# Patient Record
Sex: Female | Born: 1990 | Race: White | Hispanic: No | Marital: Married | Smoking: Never smoker
Health system: Southern US, Community
[De-identification: ages and names within clinical notes are randomized; demographics above are authoritative.]

## PROBLEM LIST (undated history)

## (undated) DIAGNOSIS — F329 Major depressive disorder, single episode, unspecified: Secondary | ICD-10-CM

## (undated) DIAGNOSIS — F32A Depression, unspecified: Secondary | ICD-10-CM

## (undated) DIAGNOSIS — F419 Anxiety disorder, unspecified: Secondary | ICD-10-CM

## (undated) HISTORY — PX: NO PAST SURGERIES: SHX2092

---

## 2017-07-29 NOTE — L&D Delivery Note (Addendum)
Delivery Note Kayla Griffith is a 27 y.o. G2P1001 at [redacted]w[redacted]d admitted for SOL, GHTN dx intrapartum .  Labor course: progressed w/o any augmentation At 0128 a viable female was delivered via spontaneous vaginal delivery (Presentation: LOA).  Vigorous infant placed directly on mom's abdomen for bonding/skin-to-skin. Delayed cord clamping x , then cord clamped x 2, and cut by fob.  APGAR: see delivery summary; weight: pending at time of note.  40 units of pitocin diluted in 1000cc LR was infused rapidly IV per protocol. The placenta separated spontaneously and delivered via CCT and maternal pushing effort.  It was inspected and appears to be intact with a 3 VC.  Placenta/Cord with the following complications: marginal insertion .  Cord pH: not done  Intrapartum complications:  GHTN Anesthesia:  epidural Episiotomy: none Lacerations:  none Suture Repair: n/a Est. Blood Loss (mL): Sponge and instrument count were correct x2.  Mom to postpartum.  Baby to Couplet care / Skin to Skin. Placenta to L&D. Plans to breast and bottlefeed Contraception: depo then vasectomy Circ: outpatient  Cheral Marker CNM, WHNP-BC 05/06/2018 1:40 AM   Cheral Marker, CNM  P [facility] Admin Pool        Please schedule this patient for PP visit in: 1 week bp check, 4-6wk pp visit  High risk pregnancy complicated by: no pnc, intrapartum GHTN  Delivery mode: SVD  Anticipated Birth Control: Depo>vasectomy  PP Procedures needed: BP check  Schedule Integrated BH visit: no  Provider: Any provider

## 2017-09-30 ENCOUNTER — Emergency Department (HOSPITAL_COMMUNITY)
Admission: EM | Admit: 2017-09-30 | Discharge: 2017-10-01 | Disposition: A | Discharge status: Home / Self Care | Payer: Medicaid Other | Attending: Emergency Medicine | Admitting: Emergency Medicine

## 2017-09-30 ENCOUNTER — Other Ambulatory Visit: Payer: Self-pay

## 2017-09-30 ENCOUNTER — Encounter (HOSPITAL_COMMUNITY): Payer: Self-pay | Admitting: Emergency Medicine

## 2017-09-30 DIAGNOSIS — O219 Vomiting of pregnancy, unspecified: Secondary | ICD-10-CM | POA: Insufficient documentation

## 2017-09-30 DIAGNOSIS — Z3491 Encounter for supervision of normal pregnancy, unspecified, first trimester: Secondary | ICD-10-CM | POA: Insufficient documentation

## 2017-09-30 DIAGNOSIS — Z3A12 12 weeks gestation of pregnancy: Secondary | ICD-10-CM | POA: Insufficient documentation

## 2017-09-30 DIAGNOSIS — Z3490 Encounter for supervision of normal pregnancy, unspecified, unspecified trimester: Secondary | ICD-10-CM

## 2017-09-30 LAB — COMPREHENSIVE METABOLIC PANEL
ALBUMIN: 4.5 g/dL (ref 3.5–5.0)
ALK PHOS: 67 U/L (ref 38–126)
ALT: 18 U/L (ref 14–54)
ANION GAP: 12 (ref 5–15)
AST: 20 U/L (ref 15–41)
BILIRUBIN TOTAL: 1.2 mg/dL (ref 0.3–1.2)
BUN: 18 mg/dL (ref 6–20)
CALCIUM: 9.3 mg/dL (ref 8.9–10.3)
CO2: 20 mmol/L — ABNORMAL LOW (ref 22–32)
Chloride: 104 mmol/L (ref 101–111)
Creatinine, Ser: 0.71 mg/dL (ref 0.44–1.00)
GFR calc Af Amer: >60 mL/min (ref >60)
GLUCOSE: 112 mg/dL — AB (ref 65–99)
Potassium: 3.7 mmol/L (ref 3.5–5.1)
Sodium: 136 mmol/L (ref 135–145)
TOTAL PROTEIN: 8 g/dL (ref 6.5–8.1)

## 2017-09-30 LAB — CBC
HCT: 41.4 % (ref 36.0–46.0)
HEMOGLOBIN: 15 g/dL (ref 12.0–15.0)
MCH: 30.6 pg (ref 26.0–34.0)
MCHC: 36.2 g/dL — AB (ref 30.0–36.0)
MCV: 84.5 fL (ref 78.0–100.0)
Platelets: 336 10*3/uL (ref 150–400)
RBC: 4.9 MIL/uL (ref 3.87–5.11)
RDW: 13.2 % (ref 11.5–15.5)
WBC: 16.8 10*3/uL — ABNORMAL HIGH (ref 4.0–10.5)

## 2017-09-30 LAB — URINALYSIS, ROUTINE W REFLEX MICROSCOPIC
Bilirubin Urine: NEGATIVE
GLUCOSE, UA: NEGATIVE mg/dL
HGB URINE DIPSTICK: NEGATIVE
Ketones, ur: 80 mg/dL — AB
Leukocytes, UA: NEGATIVE
NITRITE: NEGATIVE
Protein, ur: 100 mg/dL — AB
SPECIFIC GRAVITY, URINE: 1.028 (ref 1.005–1.030)
pH: 5 (ref 5.0–8.0)

## 2017-09-30 LAB — LIPASE, BLOOD: Lipase: 29 U/L (ref 11–51)

## 2017-09-30 LAB — HCG, QUANTITATIVE, PREGNANCY: hCG, Beta Chain, Quant, S: 85254 m[IU]/mL — ABNORMAL HIGH (ref <5)

## 2017-09-30 NOTE — ED Triage Notes (Signed)
Pt st's she is 12 weeks preg and has had diarrhea x's 2 weeks, also c/o nausea and vomiting st's unable to keep anything down

## 2017-10-01 ENCOUNTER — Emergency Department (HOSPITAL_COMMUNITY): Payer: Medicaid Other

## 2017-10-01 MED ORDER — SODIUM CHLORIDE 0.9 % IV BOLUS (SEPSIS)
1000.0000 mL | Freq: Once | INTRAVENOUS | Status: AC
  Administered 2017-10-01: 1000 mL via INTRAVENOUS

## 2017-10-01 MED ORDER — DOXYLAMINE-PYRIDOXINE 10-10 MG PO TBEC: 1.0000 | DELAYED_RELEASE_TABLET | Freq: Every day | ORAL | 0 refills | Status: DC

## 2017-10-01 MED ORDER — PRENATAL VITAMINS 0.8 MG PO TABS: 1.0000 | ORAL_TABLET | Freq: Every day | ORAL | 0 refills | Status: AC

## 2017-10-01 MED ORDER — THIAMINE HCL 100 MG/ML IJ SOLN
100.0000 mg | Freq: Once | INTRAMUSCULAR | Status: AC
  Administered 2017-10-01: 100 mg via INTRAVENOUS
  Filled 2017-10-01: qty 2

## 2017-10-01 MED ORDER — ONDANSETRON HCL 4 MG/2ML IJ SOLN
4.0000 mg | Freq: Once | INTRAMUSCULAR | Status: AC
  Administered 2017-10-01: 4 mg via INTRAVENOUS
  Filled 2017-10-01: qty 2

## 2017-10-01 NOTE — ED Provider Notes (Signed)
[facility] EMERGENCY DEPARTMENT Provider Note   CSN: [id number] Arrival date & time: 09/30/17  2157     History   Chief Complaint Chief Complaint  Patient presents with  . Emesis    HPI Kayla Griffith is a 27 y.o. female.  HPI  This is a 27 year old G3 P8 female who presents with vomiting.  Patient reports that she thinks she is approximately [redacted] weeks pregnant.  She has not had any prenatal care.  Last period was in January.  She denies any abdominal pain or vaginal bleeding.  She does report ongoing and worsening vomiting.  She reports history of similar symptoms with prior pregnancies requiring multiple medications.  She states over the last 1-2 days she has had worsening vomiting and is unable to keep anything down.  She is not taking anything for her symptoms.  History reviewed. No pertinent past medical history.  There are no active problems to display for this patient.   History reviewed. No pertinent surgical history.  OB History    Gravida Para Term Preterm AB Living   1             SAB TAB Ectopic Multiple Live Births                   Home Medications    Prior to Admission medications   Medication Sig Start Date End Date Taking? Authorizing Provider  Doxylamine-Pyridoxine (DICLEGIS) 10-10 MG TBEC Take 1 tablet by mouth at bedtime. 10/01/17   Annelies Coyt, Mayer Masker, MD  Prenatal Multivit-Min-Fe-FA (PRENATAL VITAMINS) 0.8 MG tablet Take 1 tablet by mouth daily. 10/01/17   Obaloluwa Delatte, Mayer Masker, MD    Family History No family history on file.  Social History Social History   Tobacco Use  . Smoking status: Never Smoker  . Smokeless tobacco: Never Used  Substance Use Topics  . Alcohol use: No    Frequency: Never  . Drug use: No     Allergies   Patient has no known allergies.   Review of Systems Review of Systems  Respiratory: Negative for shortness of breath.   Cardiovascular: Negative for chest pain.  Gastrointestinal: Positive for  diarrhea, nausea and vomiting. Negative for abdominal pain.  Genitourinary: Negative for vaginal bleeding and vaginal discharge.  All other systems reviewed and are negative.    Physical Exam Updated Vital Signs BP 116/72   Pulse (!) 54   Temp 98.6 F (37 C) (Oral)   Resp 18   Ht 5\' 5"  (1.651 m)   Wt 77.1 kg (170 lb)   LMP 07/29/2017 (Exact Date)   SpO2 99%   BMI 28.29 kg/m   Physical Exam  Constitutional: She is oriented to person, place, and time. She appears well-developed and well-nourished. No distress.  HENT:  Head: Normocephalic and atraumatic.  Mucous membranes dry  Cardiovascular: Normal rate, regular rhythm and normal heart sounds.  Pulmonary/Chest: Effort normal and breath sounds normal. No respiratory distress. She has no wheezes.  Abdominal: Soft. Bowel sounds are normal. There is no tenderness. There is no guarding.  Neurological: She is alert and oriented to person, place, and time.  Skin: Skin is warm and dry.  Psychiatric: She has a normal mood and affect.  Nursing note and vitals reviewed.    ED Treatments / Results  Labs (all labs ordered are listed, but only abnormal results are displayed) Labs Reviewed  COMPREHENSIVE METABOLIC PANEL - Abnormal; Notable for the following components:  Result Value   CO2 20 (*)    Glucose, Bld 112 (*)    All other components within normal limits  CBC - Abnormal; Notable for the following components:   WBC 16.8 (*)    MCHC 36.2 (*)    All other components within normal limits  URINALYSIS, ROUTINE W REFLEX MICROSCOPIC - Abnormal; Notable for the following components:   APPearance CLOUDY (*)    Ketones, ur 80 (*)    Protein, ur 100 (*)    Bacteria, UA RARE (*)    Squamous Epithelial / LPF 6-30 (*)    All other components within normal limits  HCG, QUANTITATIVE, PREGNANCY - Abnormal; Notable for the following components:   hCG, Beta Chain, Quant, S 85,254 (*)    All other components within normal limits    LIPASE, BLOOD    EKG  EKG Interpretation None       Radiology [place] Ob Comp Less 14 Wks  Result Date: 10/01/2017 CLINICAL DATA:  Initial evaluation for acute vomiting, early pregnancy. EXAM: OBSTETRIC <14 WK US AND TRANSVAGINAL OB US TECHNIQUE: Both transabdominal and transvaginal ultrasound examinations were performed for complete evaluation of the gestation as well as the maternal uterus, adnexal regions, and pelvic cul-de-sac. Transvaginal technique was performed to assess early pregnancy. COMPARISON:  None. FINDINGS: Intrauterine gestational sac: Single Yolk sac:  Present Embryo:  Present Cardiac Activity: Present Heart Rate: 175 bpm CRL:  24.4 mm   9 w   1 d                  US EDC: 05/05/2018 Subchorionic hemorrhage:  None visualized. Maternal uterus/adnexae: Ovaries are normal in appearance bilaterally. No adnexal mass. No free fluid. IMPRESSION: 1. Single viable intrauterine pregnancy as above without complication, estimated gestational age [redacted] weeks and 1 day by crown-rump length. 2. No other acute maternal uterine or adnexal abnormality identified. Electronically Signed   By: Rise MuBenjamin  McClintock M.D.   On: 10/01/2017 02:58   [place] Ob Transvaginal  Result Date: 10/01/2017 CLINICAL DATA:  Initial evaluation for acute vomiting, early pregnancy. EXAM: OBSTETRIC <14 WK US AND TRANSVAGINAL OB US TECHNIQUE: Both transabdominal and transvaginal ultrasound examinations were performed for complete evaluation of the gestation as well as the maternal uterus, adnexal regions, and pelvic cul-de-sac. Transvaginal technique was performed to assess early pregnancy. COMPARISON:  None. FINDINGS: Intrauterine gestational sac: Single Yolk sac:  Present Embryo:  Present Cardiac Activity: Present Heart Rate: 175 bpm CRL:  24.4 mm   9 w   1 d                  US EDC: 05/05/2018 Subchorionic hemorrhage:  None visualized. Maternal uterus/adnexae: Ovaries are normal in appearance bilaterally. No adnexal mass. No free  fluid. IMPRESSION: 1. Single viable intrauterine pregnancy as above without complication, estimated gestational age [redacted] weeks and 1 day by crown-rump length. 2. No other acute maternal uterine or adnexal abnormality identified. Electronically Signed   By: Rise MuBenjamin  McClintock M.D.   On: 10/01/2017 02:58    Procedures Procedures (including critical care time)  Medications Ordered in ED Medications  sodium chloride 0.9 % bolus 1,000 mL (1,000 mLs Intravenous New Bag/Given 10/01/17 0049)  ondansetron (ZOFRAN) injection 4 mg (4 mg Intravenous Given 10/01/17 0049)  thiamine (B-1) injection 100 mg (100 mg Intravenous Given 10/01/17 0049)     Initial Impression / Assessment and Plan / ED Course  I have reviewed the triage vital signs and the nursing notes.  Pertinent labs & imaging results that were available during my care of the patient were reviewed by me and considered in my medical decision making (see chart for details).     Patient presents with vomiting in early pregnancy.  She has not had any prenatal care.  She is nontoxic appearing and afebrile.  No significant vital sign derangements.  She does appear dry.  Patient was given fluids and thiamine as well as Zofran.  Bedside ultrasound was attempted.  Intrauterine gestational sac was visualized but no fetal pole.  Request formal ultrasound for confirmation of intrauterine pregnancy with transvaginal approach.  Patient would be approximately 8 weeks and 4 days by last menstrual period.  Ultrasound shows a 9-week 1 day pregnancy without any complication.  On recheck, patient states that she feels much better.  She is able to tolerate fluids on her own.  Will refer to women's clinic.  Patient discharged with DIclegis and prenatal vitamin.  After history, exam, and medical workup I feel the patient has been appropriately medically screened and is safe for discharge home. Pertinent diagnoses were discussed with the patient. Patient was given return  precautions.     Final Clinical Impressions(s) / ED Diagnoses   Final diagnoses:  Vomiting pregnancy  Intrauterine pregnancy    ED Discharge Orders        Ordered    Doxylamine-Pyridoxine (DICLEGIS) 10-10 MG TBEC  Daily at bedtime     10/01/17 0332    Prenatal Multivit-Min-Fe-FA (PRENATAL VITAMINS) 0.8 MG tablet  Daily     10/01/17 0332       Shon Baton, MD 10/01/17 [phone]

## 2018-03-15 IMAGING — US US OB TRANSVAGINAL
1 series · 14 of 28 positions shown · non-contrast
Comparison: None.

CLINICAL DATA: Initial evaluation for acute vomiting, early
pregnancy.

EXAM:
OBSTETRIC <14 WK US AND TRANSVAGINAL OB US
TECHNIQUE: Both transabdominal and transvaginal ultrasound examinations were
performed for complete evaluation of the gestation as well as the
maternal uterus, adnexal regions, and pelvic cul-de-sac.
Transvaginal technique was performed to assess early pregnancy.

[Series 1: us ob transvaginal · 0.21mm/px · 14 of 46 slices shown]
[im 2/46]
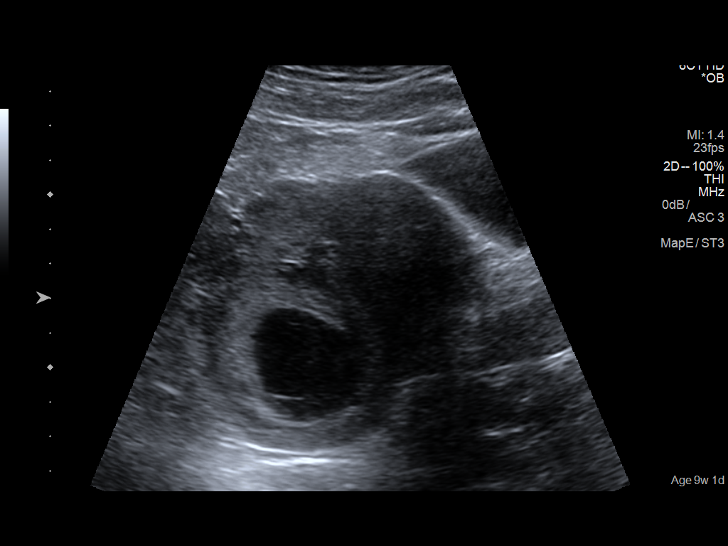
[im 6/46]
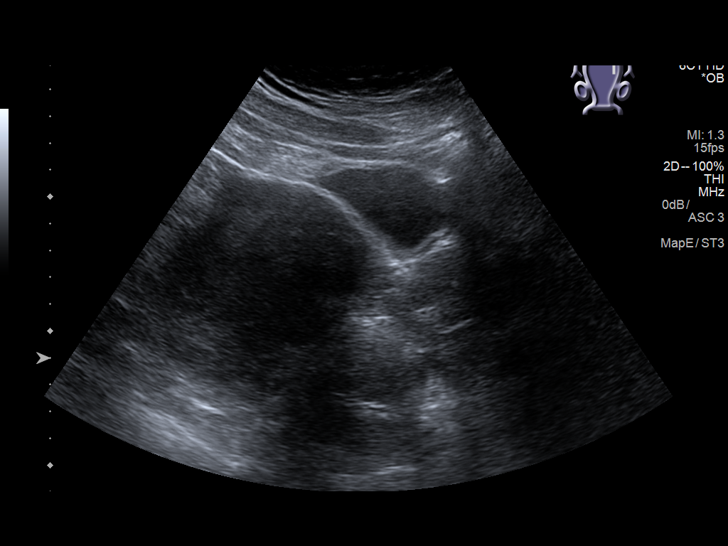
[im 9/46]
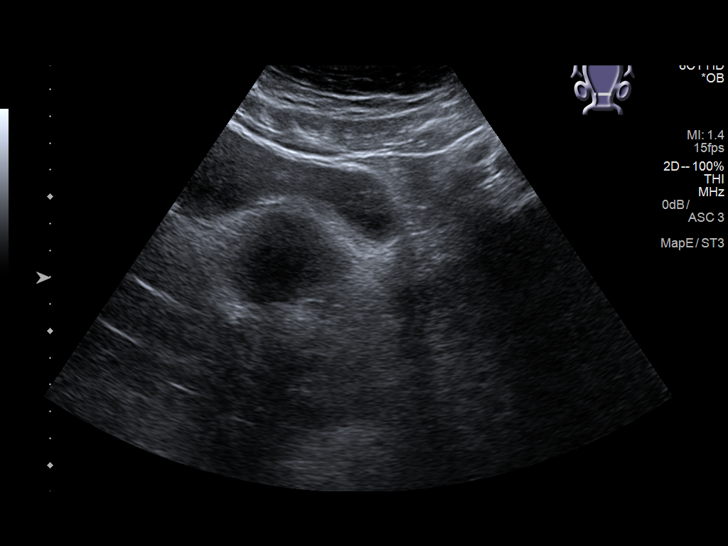
[im 12/46]
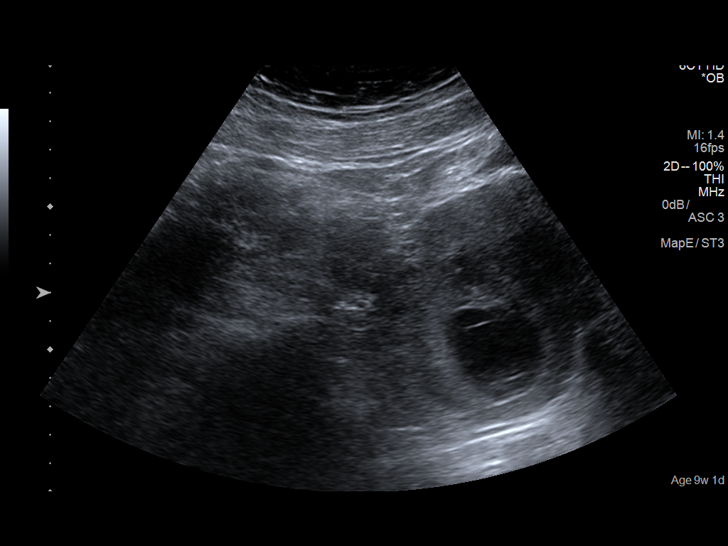
[im 16/46]
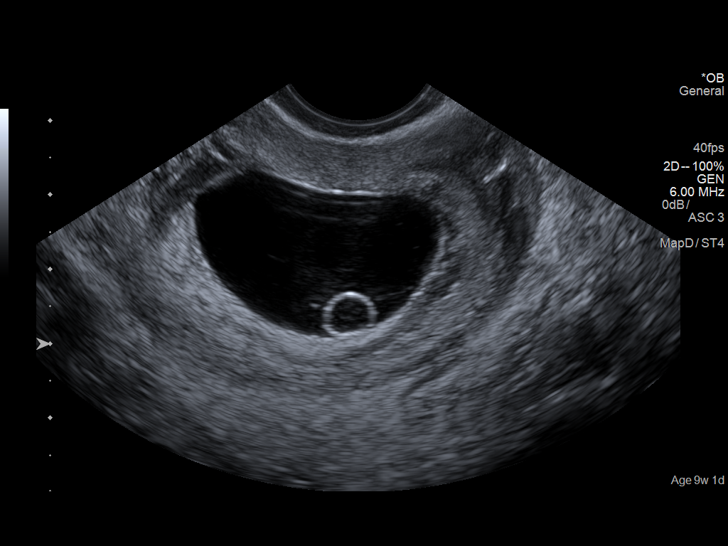
[im 19/46]
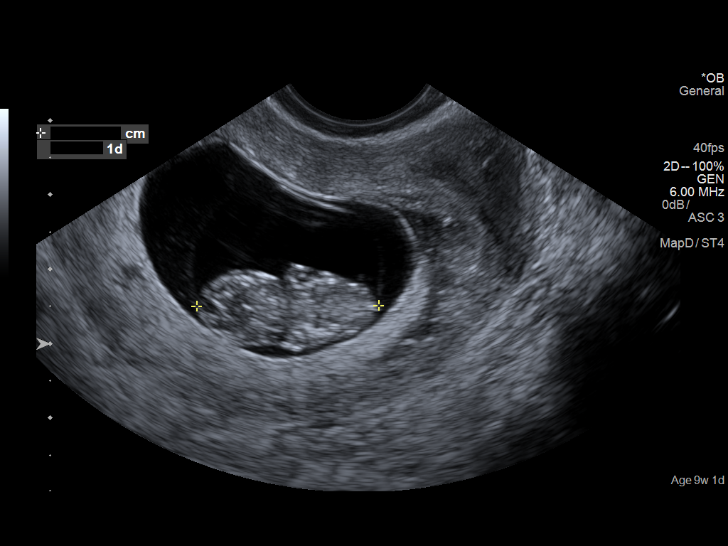
[im 22/46]
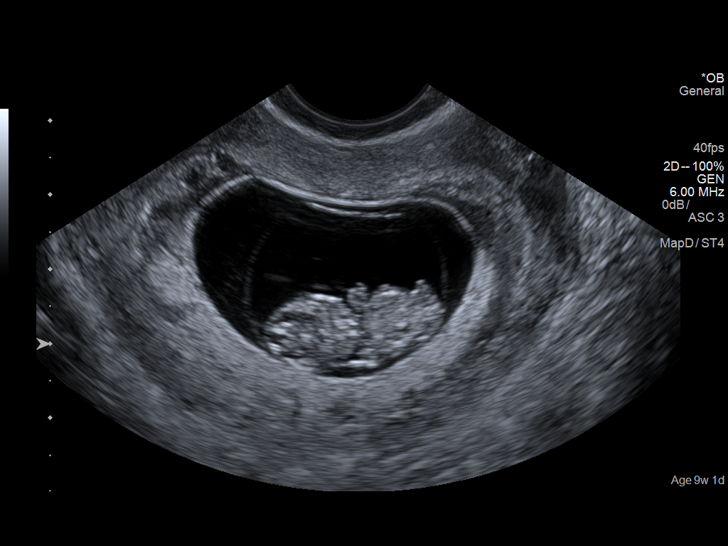
[im 26/46]
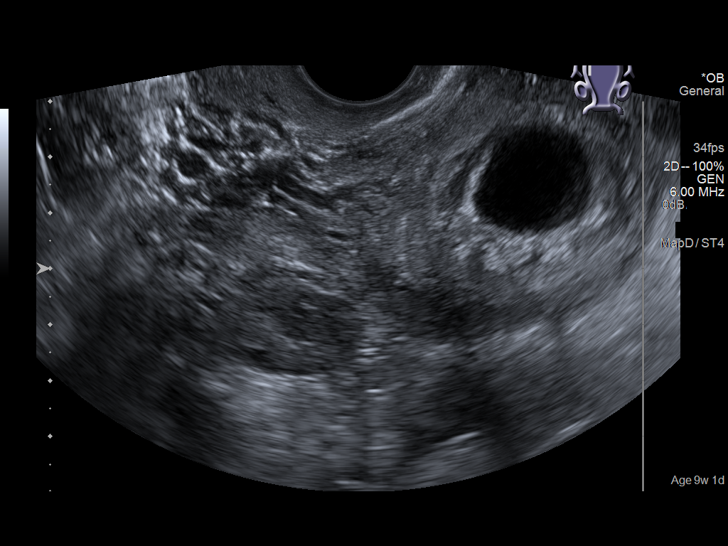
[im 29/46]
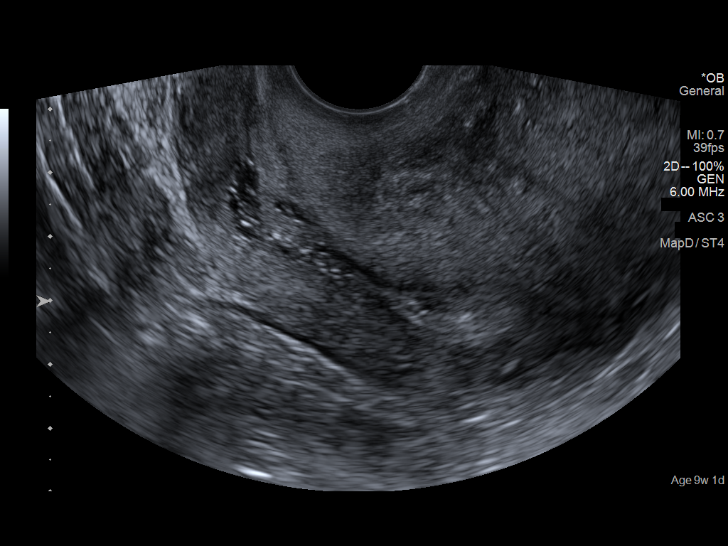
[im 32/46]
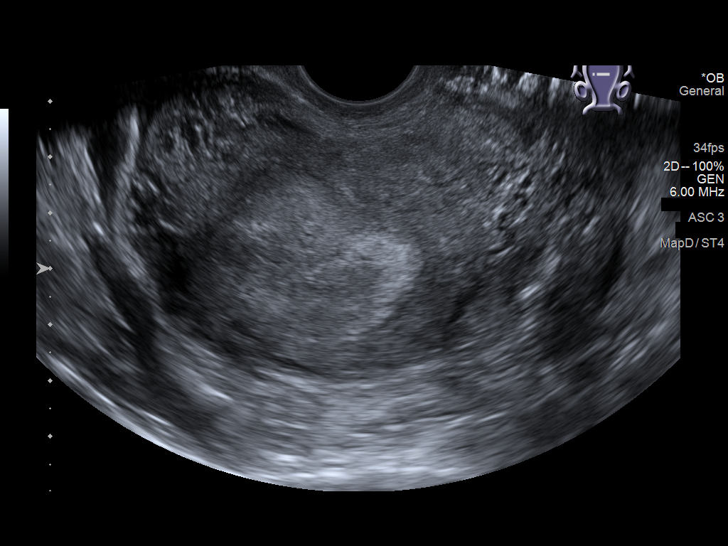
[im 36/46]
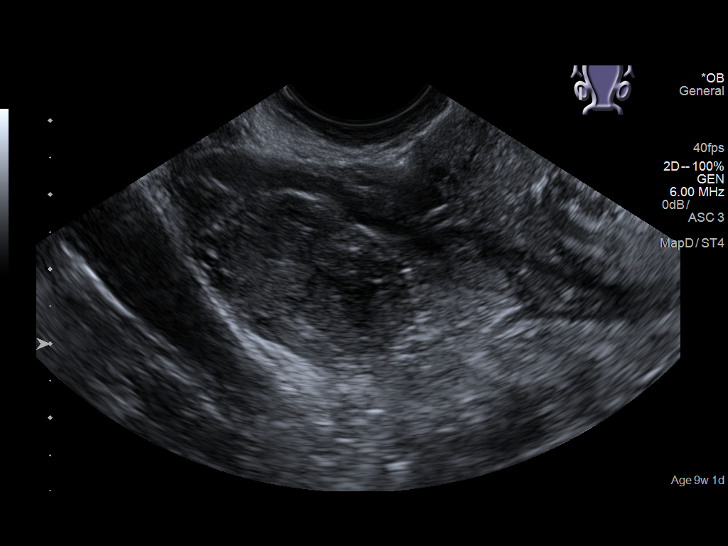
[im 39/46]
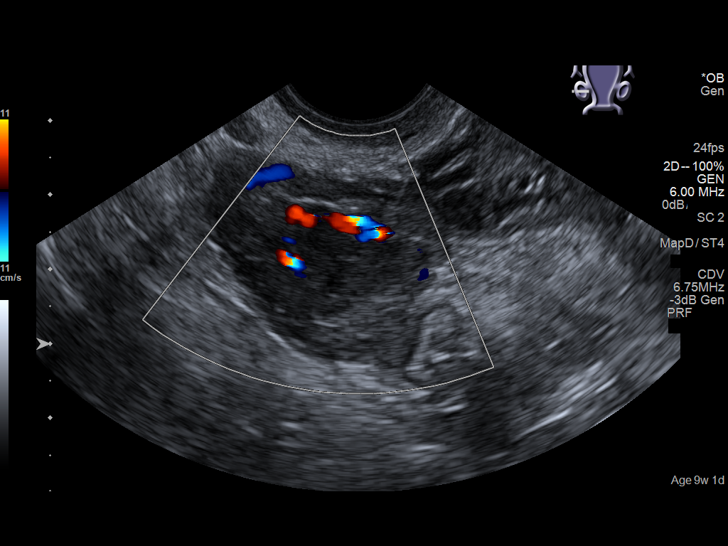
[im 42/46]
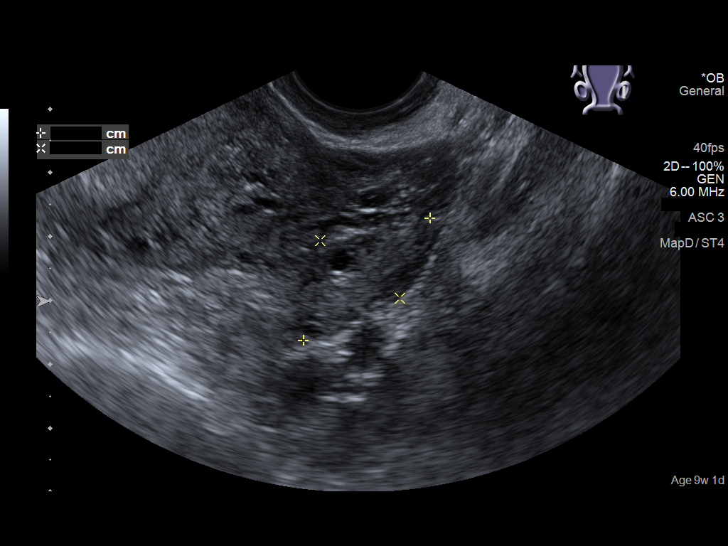
[im 46/46]
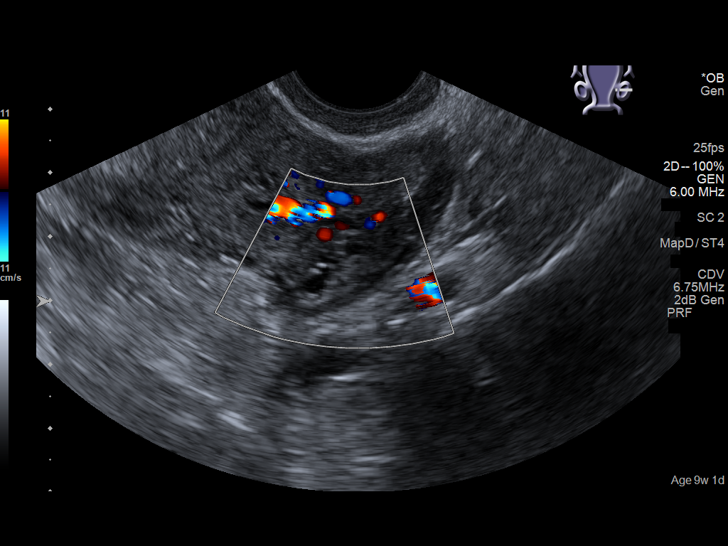

[14 of 28 positions shown; findings below may reference images not displayed]

FINDINGS: Intrauterine gestational sac: Single

Yolk sac:  Present

Embryo:  Present

Cardiac Activity: Present

Heart Rate: 175 bpm

CRL:  24.4 mm   9 w   1 d                  US EDC: 05/05/2018

Subchorionic hemorrhage:  None visualized.

Maternal uterus/adnexae: Ovaries are normal in appearance
bilaterally. No adnexal mass. No free fluid.
IMPRESSION: 1. Single viable intrauterine pregnancy as above without
complication, estimated gestational age 9 weeks and 1 day by
crown-rump length.
2. No other acute maternal uterine or adnexal abnormality
identified.

## 2018-05-05 ENCOUNTER — Inpatient Hospital Stay (HOSPITAL_COMMUNITY): Payer: Medicaid Other | Admitting: Anesthesiology

## 2018-05-05 ENCOUNTER — Encounter (HOSPITAL_COMMUNITY): Payer: Self-pay

## 2018-05-05 ENCOUNTER — Other Ambulatory Visit: Payer: Self-pay

## 2018-05-05 ENCOUNTER — Inpatient Hospital Stay (HOSPITAL_COMMUNITY)
Admission: AD | Admit: 2018-05-05 | Discharge: 2018-05-08 | DRG: 807 | Disposition: A | Discharge status: Home / Self Care | Payer: Medicaid Other | Attending: Family Medicine | Admitting: Family Medicine

## 2018-05-05 DIAGNOSIS — O0930 Supervision of pregnancy with insufficient antenatal care, unspecified trimester: Secondary | ICD-10-CM

## 2018-05-05 DIAGNOSIS — O134 Gestational [pregnancy-induced] hypertension without significant proteinuria, complicating childbirth: Secondary | ICD-10-CM | POA: Diagnosis not present

## 2018-05-05 DIAGNOSIS — Z23 Encounter for immunization: Secondary | ICD-10-CM

## 2018-05-05 DIAGNOSIS — Z3A40 40 weeks gestation of pregnancy: Secondary | ICD-10-CM

## 2018-05-05 DIAGNOSIS — O0933 Supervision of pregnancy with insufficient antenatal care, third trimester: Secondary | ICD-10-CM

## 2018-05-05 DIAGNOSIS — O139 Gestational [pregnancy-induced] hypertension without significant proteinuria, unspecified trimester: Secondary | ICD-10-CM

## 2018-05-05 DIAGNOSIS — O43123 Velamentous insertion of umbilical cord, third trimester: Secondary | ICD-10-CM | POA: Diagnosis present

## 2018-05-05 DIAGNOSIS — R030 Elevated blood-pressure reading, without diagnosis of hypertension: Secondary | ICD-10-CM | POA: Diagnosis present

## 2018-05-05 DIAGNOSIS — Z880 Allergy status to penicillin: Secondary | ICD-10-CM

## 2018-05-05 HISTORY — DX: Anxiety disorder, unspecified: F41.9

## 2018-05-05 HISTORY — DX: Major depressive disorder, single episode, unspecified: F32.9

## 2018-05-05 HISTORY — DX: Depression, unspecified: F32.A

## 2018-05-05 LAB — COMPREHENSIVE METABOLIC PANEL
ALT: 18 U/L (ref 0–44)
AST: 19 U/L (ref 15–41)
Albumin: 3.3 g/dL — ABNORMAL LOW (ref 3.5–5.0)
Alkaline Phosphatase: 150 U/L — ABNORMAL HIGH (ref 38–126)
Anion gap: 10 (ref 5–15)
BUN: 14 mg/dL (ref 6–20)
CALCIUM: 8.7 mg/dL — AB (ref 8.9–10.3)
CHLORIDE: 108 mmol/L (ref 98–111)
CO2: 19 mmol/L — ABNORMAL LOW (ref 22–32)
CREATININE: 0.56 mg/dL (ref 0.44–1.00)
Glucose, Bld: 85 mg/dL (ref 70–99)
Potassium: 3.7 mmol/L (ref 3.5–5.1)
Sodium: 137 mmol/L (ref 135–145)
Total Bilirubin: 0.4 mg/dL (ref 0.3–1.2)
Total Protein: 6.9 g/dL (ref 6.5–8.1)

## 2018-05-05 LAB — URINALYSIS, ROUTINE W REFLEX MICROSCOPIC
BILIRUBIN URINE: NEGATIVE
GLUCOSE, UA: NEGATIVE mg/dL
KETONES UR: NEGATIVE mg/dL
Nitrite: NEGATIVE
PROTEIN: NEGATIVE mg/dL
Specific Gravity, Urine: 1.021 (ref 1.005–1.030)
pH: 6 (ref 5.0–8.0)

## 2018-05-05 LAB — RAPID URINE DRUG SCREEN, HOSP PERFORMED
Amphetamines: NOT DETECTED
BARBITURATES: NOT DETECTED
Benzodiazepines: NOT DETECTED
Cocaine: NOT DETECTED
Opiates: NOT DETECTED
Tetrahydrocannabinol: POSITIVE — AB

## 2018-05-05 LAB — CBC
HCT: 36.9 % (ref 36.0–46.0)
HEMOGLOBIN: 12.4 g/dL (ref 12.0–15.0)
MCH: 26.7 pg (ref 26.0–34.0)
MCHC: 33.6 g/dL (ref 30.0–36.0)
MCV: 79.4 fL — ABNORMAL LOW (ref 80.0–100.0)
PLATELETS: 300 10*3/uL (ref 150–400)
RBC: 4.65 MIL/uL (ref 3.87–5.11)
RDW: 14.4 % (ref 11.5–15.5)
WBC: 10.3 10*3/uL (ref 4.0–10.5)
nRBC: 0 % (ref 0.0–0.2)

## 2018-05-05 LAB — PROTEIN / CREATININE RATIO, URINE
Creatinine, Urine: 201 mg/dL
PROTEIN CREATININE RATIO: 0.11 mg/mg{creat} (ref 0.00–0.15)
Total Protein, Urine: 23 mg/dL

## 2018-05-05 LAB — ABO/RH: ABO/RH(D): AB POS

## 2018-05-05 LAB — HEPATITIS B SURFACE ANTIGEN: HEP B S AG: NEGATIVE

## 2018-05-05 LAB — TYPE AND SCREEN
ABO/RH(D): AB POS
Antibody Screen: NEGATIVE

## 2018-05-05 LAB — GROUP B STREP BY PCR: GROUP B STREP BY PCR: NEGATIVE

## 2018-05-05 MED ORDER — LACTATED RINGERS IV SOLN
500.0000 mL | Freq: Once | INTRAVENOUS | Status: AC
  Administered 2018-05-05: 500 mL via INTRAVENOUS

## 2018-05-05 MED ORDER — HYDROXYZINE HCL 50 MG PO TABS
50.0000 mg | ORAL_TABLET | Freq: Four times a day (QID) | ORAL | Status: DC | PRN
  Filled 2018-05-05: qty 1

## 2018-05-05 MED ORDER — EPHEDRINE 5 MG/ML INJ
10.0000 mg | INTRAVENOUS | Status: DC | PRN
  Filled 2018-05-05: qty 2

## 2018-05-05 MED ORDER — PHENYLEPHRINE 40 MCG/ML (10ML) SYRINGE FOR IV PUSH (FOR BLOOD PRESSURE SUPPORT)
80.0000 ug | PREFILLED_SYRINGE | INTRAVENOUS | Status: DC | PRN
  Filled 2018-05-05: qty 5
  Filled 2018-05-05: qty 10

## 2018-05-05 MED ORDER — ONDANSETRON HCL 4 MG/2ML IJ SOLN
4.0000 mg | Freq: Four times a day (QID) | INTRAMUSCULAR | Status: DC | PRN
  Administered 2018-05-05: 4 mg via INTRAVENOUS
  Filled 2018-05-05: qty 2

## 2018-05-05 MED ORDER — OXYCODONE-ACETAMINOPHEN 5-325 MG PO TABS: 2.0000 | ORAL_TABLET | ORAL | Status: DC | PRN

## 2018-05-05 MED ORDER — LABETALOL HCL 5 MG/ML IV SOLN: 80.0000 mg | INTRAVENOUS | Status: DC | PRN

## 2018-05-05 MED ORDER — PHENYLEPHRINE 40 MCG/ML (10ML) SYRINGE FOR IV PUSH (FOR BLOOD PRESSURE SUPPORT)
80.0000 ug | PREFILLED_SYRINGE | INTRAVENOUS | Status: DC | PRN
  Filled 2018-05-05: qty 5

## 2018-05-05 MED ORDER — OXYTOCIN 40 UNITS IN LACTATED RINGERS INFUSION - SIMPLE MED
2.5000 [IU]/h | INTRAVENOUS | Status: DC
  Administered 2018-05-06: 2.5 [IU]/h via INTRAVENOUS
  Filled 2018-05-05: qty 1000

## 2018-05-05 MED ORDER — DIPHENHYDRAMINE HCL 50 MG/ML IJ SOLN: 12.5000 mg | INTRAMUSCULAR | Status: DC | PRN

## 2018-05-05 MED ORDER — LACTATED RINGERS IV SOLN
INTRAVENOUS | Status: DC
  Administered 2018-05-05: 21:00:00 via INTRAVENOUS

## 2018-05-05 MED ORDER — FENTANYL CITRATE (PF) 100 MCG/2ML IJ SOLN
50.0000 ug | INTRAMUSCULAR | Status: DC | PRN
  Administered 2018-05-05: 50 ug via INTRAVENOUS
  Filled 2018-05-05: qty 2

## 2018-05-05 MED ORDER — FLEET ENEMA 7-19 GM/118ML RE ENEM: 1.0000 | ENEMA | RECTAL | Status: DC | PRN

## 2018-05-05 MED ORDER — LABETALOL HCL 5 MG/ML IV SOLN: 20.0000 mg | INTRAVENOUS | Status: DC | PRN

## 2018-05-05 MED ORDER — LIDOCAINE HCL (PF) 1 % IJ SOLN
INTRAMUSCULAR | Status: DC | PRN
  Administered 2018-05-05 (×2): 6 mL via EPIDURAL

## 2018-05-05 MED ORDER — SOD CITRATE-CITRIC ACID 500-334 MG/5ML PO SOLN: 30.0000 mL | ORAL | Status: DC | PRN

## 2018-05-05 MED ORDER — OXYCODONE-ACETAMINOPHEN 5-325 MG PO TABS: 1.0000 | ORAL_TABLET | ORAL | Status: DC | PRN

## 2018-05-05 MED ORDER — LACTATED RINGERS IV SOLN: 500.0000 mL | INTRAVENOUS | Status: DC | PRN

## 2018-05-05 MED ORDER — ACETAMINOPHEN 325 MG PO TABS: 650.0000 mg | ORAL_TABLET | ORAL | Status: DC | PRN

## 2018-05-05 MED ORDER — OXYTOCIN BOLUS FROM INFUSION
500.0000 mL | Freq: Once | INTRAVENOUS | Status: AC
  Administered 2018-05-06: 500 mL via INTRAVENOUS

## 2018-05-05 MED ORDER — FENTANYL 2.5 MCG/ML BUPIVACAINE 1/10 % EPIDURAL INFUSION (WH - ANES)
14.0000 mL/h | INTRAMUSCULAR | Status: DC | PRN
  Administered 2018-05-05: 14 mL/h via EPIDURAL
  Filled 2018-05-05: qty 100

## 2018-05-05 MED ORDER — LIDOCAINE HCL (PF) 1 % IJ SOLN
30.0000 mL | INTRAMUSCULAR | Status: DC | PRN
  Filled 2018-05-05: qty 30

## 2018-05-05 MED ORDER — LABETALOL HCL 5 MG/ML IV SOLN: 40.0000 mg | INTRAVENOUS | Status: DC | PRN

## 2018-05-05 MED ORDER — HYDRALAZINE HCL 20 MG/ML IJ SOLN: 10.0000 mg | INTRAMUSCULAR | Status: DC | PRN

## 2018-05-05 NOTE — Anesthesia Preprocedure Evaluation (Signed)
Anesthesia Evaluation  Patient identified by MRN, date of birth, ID band Patient awake    Reviewed: Allergy & Precautions, H&P , NPO status , Patient's Chart, lab work & pertinent test results  Airway Mallampati: II  TM Distance: >3 FB Neck ROM: full    Dental no notable dental hx. (+) Teeth Intact   Pulmonary neg pulmonary ROS,    Pulmonary exam normal breath sounds clear to auscultation       Cardiovascular negative cardio ROS Normal cardiovascular exam Rhythm:regular Rate:Normal     Neuro/Psych negative neurological ROS     GI/Hepatic negative GI ROS, Neg liver ROS,   Endo/Other  negative endocrine ROS  Renal/GU negative Renal ROS  negative genitourinary   Musculoskeletal   Abdominal (+) + obese,   Peds  Hematology negative hematology ROS (+)   Anesthesia Other Findings   Reproductive/Obstetrics (+) Pregnancy                             Anesthesia Physical Anesthesia Plan  ASA: II  Anesthesia Plan: Epidural   Post-op Pain Management:    Induction:   PONV Risk Score and Plan:   Airway Management Planned:   Additional Equipment:   Intra-op Plan:   Post-operative Plan:   Informed Consent: I have reviewed the patients History and Physical, chart, labs and discussed the procedure including the risks, benefits and alternatives for the proposed anesthesia with the patient or authorized representative who has indicated his/her understanding and acceptance.     Plan Discussed with:   Anesthesia Plan Comments:         Anesthesia Quick Evaluation

## 2018-05-05 NOTE — Anesthesia Procedure Notes (Signed)
Epidural Patient location during procedure: OB Start time: 05/05/2018 11:00 PM End time: 05/05/2018 11:03 PM  Staffing Anesthesiologist: Leilani Able, MD Performed: anesthesiologist   Preanesthetic Checklist Completed: patient identified, site marked, surgical consent, pre-op evaluation, timeout performed, IV checked, risks and benefits discussed and monitors and equipment checked  Epidural Patient position: sitting Prep: site prepped and draped and DuraPrep Patient monitoring: continuous pulse ox and blood pressure Approach: midline Location: L3-L4 Injection technique: LOR air  Needle:  Needle type: Tuohy  Needle gauge: 17 G Needle length: 9 cm and 9 Needle insertion depth: 6 cm Catheter type: closed end flexible Catheter size: 19 Gauge Catheter at skin depth: 11 cm Test dose: negative and Other  Assessment Sensory level: T9 Events: blood not aspirated, injection not painful, no injection resistance, negative IV test and no paresthesia  Additional Notes Reason for block:procedure for pain

## 2018-05-05 NOTE — H&P (Signed)
Kayla Griffith is a 27 y.o. G21P1001 female at [redacted]w[redacted]d by LMP c/w 9wk u/s, presenting in early labor, with elevated bp's. No pnc. Denies ha, visual changes, ruq/epigastric pain, n/v.  Did home gender test, says boy. Reports active fetal movement, contractions: regular, every 2-4 minutes, vaginal bleeding: none, membranes: intact.   This pregnancy complicated by: No care  Prenatal History/Complications:  Term uncomplicated svb x 1  Past Medical History: History reviewed. No pertinent past medical history.  Past Surgical History: History reviewed. No pertinent surgical history.  Obstetrical History: OB History    Gravida  2   Para  1   Term  1   Preterm      AB      Living  1     SAB      TAB      Ectopic      Multiple      Live Births  1           Social History: Social History   Socioeconomic History  . Marital status: Married    Spouse name: Not on file  . Number of children: Not on file  . Years of education: Not on file  . Highest education level: Not on file  Occupational History  . Not on file  Social Needs  . Financial resource strain: Not on file  . Food insecurity:    Worry: Not on file    Inability: Not on file  . Transportation needs:    Medical: Not on file    Non-medical: Not on file  Tobacco Use  . Smoking status: Never Smoker  . Smokeless tobacco: Never Used  Substance and Sexual Activity  . Alcohol use: No    Frequency: Never  . Drug use: No  . Sexual activity: Not on file  Lifestyle  . Physical activity:    Days per week: Not on file    Minutes per session: Not on file  . Stress: Not on file  Relationships  . Social connections:    Talks on phone: Not on file    Gets together: Not on file    Attends religious service: Not on file    Active member of club or organization: Not on file    Attends meetings of clubs or organizations: Not on file    Relationship status: Not on file  Other Topics Concern  . Not on file   Social History Narrative  . Not on file    Family History: No family history on file.  Allergies: Allergies  Allergen Reactions  . Penicillins Nausea And Vomiting    Medications Prior to Admission  Medication Sig Dispense Refill Last Dose  . Prenatal Multivit-Min-Fe-FA (PRENATAL VITAMINS) 0.8 MG tablet Take 1 tablet by mouth daily. 60 tablet 0 05/05/2018 at Unknown time  . Doxylamine-Pyridoxine (DICLEGIS) 10-10 MG TBEC Take 1 tablet by mouth at bedtime. 60 tablet 0     Review of Systems  Pertinent pos/neg as indicated in HPI  Blood pressure (!) 147/88, pulse 68, temperature 98.2 F (36.8 C), temperature source Oral, resp. rate 16, weight 100.1 kg, last menstrual period 07/29/2017. General appearance: alert, cooperative and no distress Lungs: clear to auscultation bilaterally Heart: regular rate and rhythm Abdomen: gravid, soft, non-tender Extremities: tr edema DTR's 2+  Fetal monitoring: FHR: 150 bpm, variability: moderate,  Accelerations: Present,  decelerations:  Absent Uterine activity: q 2-64mins Dilation: 2 Effacement (%): 50 Station: -2 Exam by:: TLYTLE RN Presentation: cephalic  Prenatal labs: ABO, Rh:  AB+ Antibody:  neg Rubella:  pend RPR:  pend  HBsAg:  neg  HIV:  pend  GBS:  pcr pend   2hr GTT: no care Genetic screening:  No care Anatomy US: no care  Results for orders placed or performed during the hospital encounter of 05/05/18 (from the past 24 hour(s))  Urinalysis, Routine w reflex microscopic   Collection Time: 05/05/18  7:39 PM  Result Value Ref Range   Color, Urine YELLOW YELLOW   APPearance HAZY (A) CLEAR   Specific Gravity, Urine 1.021 1.005 - 1.030   pH 6.0 5.0 - 8.0   Glucose, UA NEGATIVE NEGATIVE mg/dL   Hgb urine dipstick SMALL (A) NEGATIVE   Bilirubin Urine NEGATIVE NEGATIVE   Ketones, ur NEGATIVE NEGATIVE mg/dL   Protein, ur NEGATIVE NEGATIVE mg/dL   Nitrite NEGATIVE NEGATIVE   Leukocytes, UA TRACE (A) NEGATIVE   RBC /  HPF 0-5 0 - 5 RBC/hpf   WBC, UA 6-10 0 - 5 WBC/hpf   Bacteria, UA RARE (A) NONE SEEN   Squamous Epithelial / LPF 11-20 0 - 5   Mucus PRESENT    Ca Oxalate Crys, UA PRESENT      Assessment:  [redacted]w[redacted]d SIUP  G2P1001  Elevated bp  No prenatal care  Cat 1 FHR  GBS  unknown  Plan:  Admit to BS  IV pain meds/epidural prn active labor  Expectant management for now  Pre-e labs  GBS pcr  Anticipate NSVB   Plans to breast and bottlefeed  Contraception: depo then vasectomy  Circumcision: outpatient if boy  Cheral Marker CNM, WHNP-BC 05/05/2018, 8:29 PM

## 2018-05-05 NOTE — MAU Note (Addendum)
Ctx since 11, states they are every 6 min  No bleeding, no LOF, +FM  NO PNC

## 2018-05-06 ENCOUNTER — Encounter (HOSPITAL_COMMUNITY): Payer: Self-pay

## 2018-05-06 DIAGNOSIS — O0930 Supervision of pregnancy with insufficient antenatal care, unspecified trimester: Secondary | ICD-10-CM

## 2018-05-06 DIAGNOSIS — Z3A40 40 weeks gestation of pregnancy: Secondary | ICD-10-CM

## 2018-05-06 DIAGNOSIS — O139 Gestational [pregnancy-induced] hypertension without significant proteinuria, unspecified trimester: Secondary | ICD-10-CM

## 2018-05-06 DIAGNOSIS — O134 Gestational [pregnancy-induced] hypertension without significant proteinuria, complicating childbirth: Secondary | ICD-10-CM

## 2018-05-06 LAB — GC/CHLAMYDIA PROBE AMP (~~LOC~~) NOT AT ARMC
CHLAMYDIA, DNA PROBE: NEGATIVE
Neisseria Gonorrhea: NEGATIVE

## 2018-05-06 LAB — RPR: RPR Ser Ql: NONREACTIVE

## 2018-05-06 LAB — HIV ANTIBODY (ROUTINE TESTING W REFLEX): HIV SCREEN 4TH GENERATION: NONREACTIVE

## 2018-05-06 MED ORDER — ONDANSETRON HCL 4 MG/2ML IJ SOLN: 4.0000 mg | INTRAMUSCULAR | Status: DC | PRN

## 2018-05-06 MED ORDER — ERYTHROMYCIN 5 MG/GM OP OINT
TOPICAL_OINTMENT | OPHTHALMIC | Status: AC
  Filled 2018-05-06: qty 1

## 2018-05-06 MED ORDER — BISACODYL 10 MG RE SUPP: 10.0000 mg | Freq: Every day | RECTAL | Status: DC | PRN

## 2018-05-06 MED ORDER — SENNOSIDES-DOCUSATE SODIUM 8.6-50 MG PO TABS
2.0000 | ORAL_TABLET | ORAL | Status: DC
  Administered 2018-05-07 (×2): 2 via ORAL
  Filled 2018-05-06 (×2): qty 2

## 2018-05-06 MED ORDER — BENZOCAINE-MENTHOL 20-0.5 % EX AERO: 1.0000 "application " | INHALATION_SPRAY | CUTANEOUS | Status: DC | PRN

## 2018-05-06 MED ORDER — DIBUCAINE 1 % RE OINT: 1.0000 "application " | TOPICAL_OINTMENT | RECTAL | Status: DC | PRN

## 2018-05-06 MED ORDER — INFLUENZA VAC SPLIT QUAD 0.5 ML IM SUSY
0.5000 mL | PREFILLED_SYRINGE | INTRAMUSCULAR | Status: AC
  Administered 2018-05-07: 0.5 mL via INTRAMUSCULAR

## 2018-05-06 MED ORDER — IBUPROFEN 600 MG PO TABS
600.0000 mg | ORAL_TABLET | Freq: Four times a day (QID) | ORAL | Status: DC
  Administered 2018-05-06 – 2018-05-08 (×10): 600 mg via ORAL
  Filled 2018-05-06 (×10): qty 1

## 2018-05-06 MED ORDER — ACETAMINOPHEN 325 MG PO TABS: 650.0000 mg | ORAL_TABLET | ORAL | Status: DC | PRN

## 2018-05-06 MED ORDER — SODIUM CHLORIDE 0.9 % IV SOLN: 250.0000 mL | INTRAVENOUS | Status: DC | PRN

## 2018-05-06 MED ORDER — SODIUM CHLORIDE 0.9% FLUSH
3.0000 mL | Freq: Two times a day (BID) | INTRAVENOUS | Status: DC
  Administered 2018-05-06: 3 mL via INTRAVENOUS

## 2018-05-06 MED ORDER — WITCH HAZEL-GLYCERIN EX PADS: 1.0000 "application " | MEDICATED_PAD | CUTANEOUS | Status: DC | PRN

## 2018-05-06 MED ORDER — ONDANSETRON HCL 4 MG PO TABS: 4.0000 mg | ORAL_TABLET | ORAL | Status: DC | PRN

## 2018-05-06 MED ORDER — TETANUS-DIPHTH-ACELL PERTUSSIS 5-2.5-18.5 LF-MCG/0.5 IM SUSP: 0.5000 mL | Freq: Once | INTRAMUSCULAR | Status: DC

## 2018-05-06 MED ORDER — PRENATAL MULTIVITAMIN CH
1.0000 | ORAL_TABLET | Freq: Every day | ORAL | Status: DC
  Administered 2018-05-06 – 2018-05-08 (×3): 1 via ORAL
  Filled 2018-05-06 (×3): qty 1

## 2018-05-06 MED ORDER — SODIUM CHLORIDE 0.9% FLUSH: 3.0000 mL | INTRAVENOUS | Status: DC | PRN

## 2018-05-06 MED ORDER — ZOLPIDEM TARTRATE 5 MG PO TABS: 5.0000 mg | ORAL_TABLET | Freq: Every evening | ORAL | Status: DC | PRN

## 2018-05-06 MED ORDER — FLEET ENEMA 7-19 GM/118ML RE ENEM: 1.0000 | ENEMA | Freq: Every day | RECTAL | Status: DC | PRN

## 2018-05-06 MED ORDER — DIPHENHYDRAMINE HCL 25 MG PO CAPS: 25.0000 mg | ORAL_CAPSULE | Freq: Four times a day (QID) | ORAL | Status: DC | PRN

## 2018-05-06 MED ORDER — SIMETHICONE 80 MG PO CHEW: 80.0000 mg | CHEWABLE_TABLET | ORAL | Status: DC | PRN

## 2018-05-06 MED ORDER — MEASLES, MUMPS & RUBELLA VAC ~~LOC~~ INJ
0.5000 mL | INJECTION | Freq: Once | SUBCUTANEOUS | Status: DC
  Filled 2018-05-06: qty 0.5

## 2018-05-06 MED ORDER — COCONUT OIL OIL: 1.0000 "application " | TOPICAL_OIL | Status: DC | PRN

## 2018-05-06 NOTE — Anesthesia Postprocedure Evaluation (Signed)
Anesthesia Post Note  Patient: NAOMIA LENDERMAN  Procedure(s) Performed: AN AD HOC LABOR EPIDURAL     Patient location during evaluation: Mother Baby Anesthesia Type: Epidural Level of consciousness: awake and alert Pain management: pain level controlled Vital Signs Assessment: post-procedure vital signs reviewed and stable Respiratory status: spontaneous breathing Cardiovascular status: blood pressure returned to baseline Postop Assessment: no headache, patient able to bend at knees, no backache, no apparent nausea or vomiting, epidural receding, adequate PO intake and able to ambulate Anesthetic complications: no    Last Vitals:  Vitals:   05/06/18 0425 05/06/18 0735  BP: 117/78 119/76  Pulse: 67 (!) 58  Resp: 18 18  Temp: 36.6 C 36.6 C  SpO2:      Last Pain:  Vitals:   05/06/18 0735  TempSrc: Axillary  PainSc: 2    Pain Goal: Patients Stated Pain Goal: 3 (05/06/18 0735)               Salome Arnt

## 2018-05-06 NOTE — Lactation Note (Signed)
This note was copied from a baby's chart. Lactation Consultation Note  Patient Name: Kayla Griffith [id number] Date: 05/06/2018 Reason for consult: Initial assessment;Term   Initial assessment with mom of 10 hour old infant. Mom reports she BF her first child for 3 months and struggled with milk supply and eventually switched to formula. Mom reports + breast changes with pregnancy.   Infant with 3 BF for 10-15 minutes, 1 void and 2 stools since birth. LATCH scores 7. Dad was changing a stool when LC was in the room. Mom reports infant is feeding well with no pain with feeding. Mom reports she has been shown how to hand express.   Reviewed BF basics with mom. Enc mom to offer breast with feeding cues with goal of 8-12 x in 24 hours. Enc mom to use good pillow and head support with feeding. Enc mom to keep infant awake at the breast and to massage/compress breast with feeding. Enc mom to hand express with each feeding before and after feeding.   [facility] Brochure given, mom informed of IP/OP Services, BF Support Groups and LC phone #. Mom has an electric breast pump at home, she is unsure of what brand.   Mom is planning to take a Depo shot for birth control, informed mom it can decrease milk supply in some moms, enc mom to monitor her milk supply and infant weights. Mom voiced understanding. Mom did not take the Depo shot with her first child.   Mom and dad report they do not have any questions/concerns. Enc mom to call out for feeding assistance as needed.   Maternal Data Formula Feeding for Exclusion: Yes Reason for exclusion: Mother's choice to formula and breast feed on admission Has patient been taught Hand Expression?: Yes Does the patient have breastfeeding experience prior to this delivery?: Yes  Feeding Feeding Type: Breast Fed  LATCH Score                   Interventions Interventions: Breast feeding basics reviewed;Support pillows;Position options;Skin to skin;Breast  compression;Breast massage  Lactation Tools Discussed/Used WIC Program: No   Consult Status Consult Status: Follow-up Date: 05/07/18 Follow-up type: In-patient    Kayla Griffith Kayla Griffith 05/06/2018, 12:04 PM

## 2018-05-06 NOTE — Progress Notes (Signed)
Patient ID: Kayla Griffith, female   DOB: [birth year], 27 y.o.   MRN: [id number] Kayla Griffith is a 27 y.o. G2P1001 at [redacted]w[redacted]d admitted for early labor, elevated bp  Subjective: Comfortable w/ epidural  Objective: BP 119/69   Pulse 88   Temp 98.2 F (36.8 C) (Oral)   Resp 16   Ht 5\' 5"  (1.651 m)   Wt 100.1 kg   LMP 07/29/2017 (Exact Date)   BMI 36.73 kg/m  No intake/output data recorded.  BPs 140s/70-90s  FHT:  FHR: 135 bpm, variability: moderate,  accelerations:  Present,  decelerations:  Absent UC:   regular, every 2-3 minutes  SVE:   Dilation: 8 Effacement (%): 90 Station: 0 Exam by:: Mervin Kung, RN    SROM clear fluid @ 0004  Labs: Lab Results  Component Value Date   WBC 10.3 05/05/2018   HGB 12.4 05/05/2018   HCT 36.9 05/05/2018   MCV 79.4 (L) 05/05/2018   PLT 300 05/05/2018    Assessment / Plan: Spontaneous labor, progressing normally, GHTN  Labor: Progressing normally Fetal Wellbeing:  Category I Pain Control:  Epidural Pre-eclampsia: bp's stable, labs normal I/D:  n/a Anticipated MOD:  NSVD  Cheral Marker CNM, WHNP-BC 05/06/2018, 12:22 AM

## 2018-05-07 LAB — RUBELLA SCREEN: Rubella: 6.28 index (ref >0.99)

## 2018-05-07 MED ORDER — SERTRALINE HCL 25 MG PO TABS
25.0000 mg | ORAL_TABLET | Freq: Every day | ORAL | Status: DC
  Administered 2018-05-07 – 2018-05-08 (×2): 25 mg via ORAL
  Filled 2018-05-07 (×3): qty 1

## 2018-05-07 NOTE — Lactation Note (Signed)
This note was copied from a baby's chart. Lactation Consultation Note  Patient Name: Kayla Griffith Date: 05/07/2018 Reason for consult: Follow-up assessment   Mom with infant latched in cradle states that infant is sleepy.  Infant latched shallow and unsupported without pillows and mom is hunched over.  LC offered to assist with repositioning.  LC educated mom and dad on good head/neck support of infant during feeds and of the breast.    Infant latched well to right side and had rhythmic sucks with swallows heard during compression and massage of breast.  Mom fed in cross cradle.    LC encouraged mom to hand express and demonstrated on right side while infant fed on left.  Mom returned demo and approx. 4 ml collected into container.  Infant was then burped and cueing so LC assisted mom in latching infant in football hold on left side which infant struggled with to teacup hold was shown to mom and dad.  Infant did latch after several attempts.    Curved tip syringe left for family to feed EBM back to infant; RN notified that family may call out for assistance with feed.  DEBP set up; parts, assembly and cleaning explained to family.  LC encouraged mom to hand express prior to and after pumping and or feeding to and to feed back any EBM collected; and to call for assistance if needed.    Baby has increased bilirubin and mom states he has been sleepy at the breast.  LC reviewed benefits of pumping after BF infant in order to ensure good stimulation for milk supply and to collect any EBM for sleepy infant due to increased bilirubin.  Maternal Data    Feeding Feeding Type: Breast Fed  LATCH Score Latch: Repeated attempts needed to sustain latch, nipple held in mouth throughout feeding, stimulation needed to elicit sucking reflex.  Audible Swallowing: A few with stimulation  Type of Nipple: Everted at rest and after stimulation  Comfort (Breast/Nipple): Soft /  non-tender  Hold (Positioning): Assistance needed to correctly position infant at breast and maintain latch.  LATCH Score: 7  Interventions Interventions: Assisted with latch;Breast feeding basics reviewed;Support pillows;Skin to skin;Expressed milk;Position options;Breast massage;Hand express;Adjust position;Breast compression;DEBP  Lactation Tools Discussed/Used Pump Review: Setup, frequency, and cleaning;Milk Storage;Other (comment) Initiated by:: Tresa Endo b Date initiated:: 05/07/18   Consult Status Consult Status: Follow-up Date: 05/08/18 Follow-up type: In-patient    Maryruth Hancock [facility] 05/07/2018, 12:28 PM

## 2018-05-07 NOTE — Progress Notes (Signed)
CSW spoke with Bernie Covey with [facility] CPS- no barriers to discharge at this time.   Stacy Gardner, LCSW Clinical Social Worker  [facility]  [phone]

## 2018-05-07 NOTE — Clinical Social Work Maternal (Signed)
CLINICAL SOCIAL WORK MATERNAL/CHILD NOTE  Patient Details  Name: Kayla Griffith MRN: [id number] Date of Birth: 02/25/1991  Date:  12-22-17  Clinical Social Worker Initiating Note:  Kingsley Spittle, LCSW  Date/Time: Initiated:  2018/01/28/1055     Child's Name:  Kayla Griffith    Biological Parents:  Mother, Father   Need for Interpreter:  None   Reason for Referral:  Late or No Prenatal Care , Current Substance Use/Substance Use During Pregnancy    Address:  [place] [address]    Phone number:  [phone] (home)     Additional phone number: N/A  Household Members/Support Persons (HM/SP):   Household Member/Support Person 1, Household Member/Support Person 2   HM/SP Name Relationship DOB or Age  HM/SP -[place]     HM/SP -2 Careers information officer  Daughter  28 years old   HM/SP -3        HM/SP -4        HM/SP -5        HM/SP -6        HM/SP -7        HM/SP -8          Natural Supports (not living in the home):  Parent, Extended Family   Professional Supports:     Employment: Unemployed   Type of Work:     Education:      Homebound arranged:    Museum/gallery curator Resources:  Buyer, retail )   Other Resources:      Cultural/Religious Considerations Which May Impact Care:  N/a  Strengths:  Compliance with medical plan , Ability to meet basic needs , Home prepared for child , Pediatrician chosen   Psychotropic Medications:         Pediatrician:    [facility] [place]  Pediatrician List:   [facility] (Peds)  [place]      Pediatrician Fax Number:    Risk Factors/Current Problems:  Substance Use    Cognitive State:  Alert , Goal Oriented    Mood/Affect:  Calm , Interested , Happy , Comfortable    CSW Assessment: CSW met with MOB via bedside- FOB was present in room but stepped out for  conversation. MOB was pleasant and appropriate during conversation. Babies name is Caleel Kiner and this is her second child- daughter is Kayla Griffith 13 years old. MOB and FOB, Marcello Moores, are currently living together with their two children with their primary supports being parents. MOB's parents currently live in [place] and are truck drivers. FOB parents live in [place] and work at [employer]. MOB is currently unemployed and plans to take her time to find new employment. FOB is the primary care giver to the family- he is working full time as a Scientist, research (medical) at a store in [employer]. FOB has insuranceLibrarian, academic)  through company and plan includes MOB and children. MOB/ family do not receive any additional services (wic/ food stamps) at this time. MOB states this insurance is knew within the past month.   MOB states she did not receive any PNC during pregnancy due to not having any insurance at the time. She states she did go to the ED in March to confirm pregnancy however was unable to afford other doctor appointment at that time. CSW questioned if MOB was using drugs/ alcohol  during her pregnancy- MOB stated "no". CSW informed MOB of positive [facility] drug screen for baby and herself. MOB continued to state she did not use drugs or alcohol during pregnancy. CSW informed MOB that CPS report would be completed due to no PNC and positive drug screen- MOB voiced understanding at questioned process/ follow up.    CSW will complete CPS report with [place] and notify RN of disposition plan. At this time, there are barriers to discharge.   CSW Plan/Description:  CSW Awaiting CPS Disposition Plan, Child Protective Service Report , CSW Will Continue to Monitor Umbilical Cord Tissue Drug Screen Results and Make Report if Delila Spence, LCSW 09-21-2017, 11:29 AM

## 2018-05-07 NOTE — Progress Notes (Signed)
POSTPARTUM PROGRESS NOTE  Post Partum Day 1 Subjective:  Kayla Griffith is a 27 y.o. [id number] [redacted]w[redacted]d s/p SVD, with hx of IP GHTN.  No acute events overnight.  Pt denies problems with ambulating, voiding or po intake.  She denies nausea or vomiting.  Pain is well controlled.  She has had flatus. She has not had bowel movement.  Lochia Moderate, decreasing, about same as a heavy period.  Denies HA, lightheadedness, visual changes, RUQ pain, lightheadedness.  Objective: Blood pressure 129/80, pulse 64, temperature 98.2 F (36.8 C), temperature source Oral, resp. rate 18, height 5\' 5"  (1.651 m), weight 100.1 kg, last menstrual period 07/29/2017, SpO2 99 %, unknown if currently breastfeeding.  Physical Exam:  General: alert, cooperative and no distress Lochia:normal flow, decreased Heart: RRR no m/r/g Abdomen: +BS, soft.  Uterine Fundus: firm, umbilicus level DVT Evaluation: No calf swelling or tenderness Extremities: no edema  Recent Labs    05/05/18 2020  HGB 12.4  HCT 36.9    Assessment/Plan:  ASSESSMENT: Kayla Griffith is a 27 y.o. [id number] [redacted]w[redacted]d s/p SVD  #gHTN - pressures have been nml overnight, enrolled in [place] Plans to outpatient circ #Breastfeeding Plans to breastfeed.  #contraception  Was told by lactation that depo reduces her milk supply - provided counseling and advised to follow-up with fellow or midwife about options. Husband plans vasectomy  Discharge on PPD2   LOS: 2 days   Myrtie Hawk, Medical Student 05/07/2018, 10:20 AM

## 2018-05-08 MED ORDER — SERTRALINE HCL 25 MG PO TABS: 25.0000 mg | ORAL_TABLET | Freq: Every day | ORAL | 0 refills | Status: AC

## 2018-05-08 MED ORDER — SENNOSIDES-DOCUSATE SODIUM 8.6-50 MG PO TABS: 2.0000 | ORAL_TABLET | ORAL | 0 refills | Status: AC

## 2018-05-08 MED ORDER — IBUPROFEN 600 MG PO TABS: 600.0000 mg | ORAL_TABLET | Freq: Four times a day (QID) | ORAL | 0 refills | Status: AC

## 2018-05-08 MED ORDER — MEDROXYPROGESTERONE ACETATE 150 MG/ML IM SUSP
150.0000 mg | Freq: Once | INTRAMUSCULAR | Status: AC
  Administered 2018-05-08: 150 mg via INTRAMUSCULAR
  Filled 2018-05-08: qty 1

## 2018-05-08 NOTE — Lactation Note (Signed)
This note was copied from a baby's chart. Lactation Consultation Note  Patient Name: Kayla Griffith [id number] Date: 05/08/2018 Reason for consult: Follow-up assessment;Infant weight loss(7% weight loss / 11.8 Bilirubin ) 7% weight loss, Bili 11.8 at 0500 today.  Baby is 11 hours old  LC reviewed and updated the doc flow sheets per mom for several feedings.  As LC entered the room , mom working on latching , needed assistance with  Positioning the baby in a cross cradle position, depth obtained, swallows noted,  Increased with breast compressions. Baby fed for 20 mins, nipple well rounded when  Baby released.  Per mom breast are fuller and heavier and she has been post pumping with good results.  LC stressed the importance of STS feedings until the baby is back to birth weight and gaining Steadily, and can stay awake for a feeding. Discussed nutritive vs non - nutritive feeding patterns and the importance of watching for hanging out latched.  Sore nipple and engorgement prevention and tx reviewed. LC instructed mom on the use hand pump, shells between feedings except when sleeping, and comfort gels.  Mother informed of post-discharge support and given phone number to the lactation department, including services for phone call assistance; out-patient appointments; and breastfeeding support group. List of other breastfeeding resources in the community given in the handout. Encouraged mother to call for problems or concerns related to breastfeeding.  LC recommended the curved tip syringe being a short term breast feeding tool.  If mom finds the baby is narrowing the sucking pattern, to consider medium based nipple/ PACE feeding explained.       Maternal Data Has patient been taught Hand Expression?: Yes  Feeding Feeding Type: Breast Fed(baby latched )  LATCH Score Latch: Grasps breast easily, tongue down, lips flanged, rhythmical sucking.(depth at the breast noticed )  Audible  Swallowing: Spontaneous and intermittent  Type of Nipple: Everted at rest and after stimulation  Comfort (Breast/Nipple): Filling, red/small blisters or bruises, mild/mod discomfort  Hold (Positioning): Assistance needed to correctly position infant at breast and maintain latch.  LATCH Score: 8  Interventions Interventions: Breast feeding basics reviewed;Assisted with latch;Skin to skin;Breast massage;Breast compression;Adjust position;Support pillows  Lactation Tools Discussed/Used Tools: Shells;Pump Shell Type: Inverted Breast pump type: Double-Electric Breast Pump;Manual Pump Review: Setup, frequency, and cleaning;Milk Storage Initiated by:: MAI Date initiated:: 05/08/18   Consult Status Consult Status: Complete Date: 05/08/18    [name] 05/08/2018, 11:22 AM

## 2018-05-08 NOTE — Discharge Summary (Addendum)
OB Discharge Summary     Patient Name: Kayla Griffith DOB: 1991/02/07 MRN: [id number]  Date of admission: 05/05/2018 Delivering MD: Shawna Clamp R   Date of discharge: 05/08/2018  Admitting diagnosis: CTX Intrauterine pregnancy: [redacted]w[redacted]d     Secondary diagnosis:  Active Problems:   Normal labor   Gestational hypertension   No prenatal care in current pregnancy  Additional problems: None      Discharge diagnosis: Term Pregnancy Delivered and Gestational Hypertension                                                                                                Post partum procedures:None   Augmentation: None   Complications: None  Hospital course:  Onset of Labor With Vaginal Delivery     27 y.o. yo [id number] at [redacted]w[redacted]d was admitted in Latent Labor on 05/05/2018. Patient had an uncomplicated labor course as follows:  Membrane Rupture Time/Date: 12:04 AM ,05/06/2018   Intrapartum Procedures: Episiotomy: None [1]                                         Lacerations:  None [1]  Patient had a delivery of a Viable infant. 05/06/2018  Information for the patient's newborn:  Suzane, Vanderweide [[id number]]  Delivery Method: Vaginal, Spontaneous(Filed from Delivery Summary)    Pateint had an uncomplicated postpartum course.  She is ambulating, tolerating a regular diet, passing flatus, and urinating well. Patient is discharged home in stable condition on 05/08/18.   Physical exam  Vitals:   05/06/18 2250 05/07/18 0528 05/07/18 2303 05/08/18 0532  BP: 125/74 129/80 137/89 130/76  Pulse: 72 64 62 61  Resp: 18  (!) 22 17  Temp: 98.2 F (36.8 C)  97.7 F (36.5 C) 97.7 F (36.5 C)  TempSrc: Oral  Oral Oral  SpO2: 99%  100%   Weight:      Height:       General: alert, cooperative and no distress Lochia: appropriate Uterine Fundus: firm Incision: N/A DVT Evaluation: No evidence of DVT seen on physical exam. No significant calf/ankle edema. Labs: Lab Results  Component Value  Date   WBC 10.3 05/05/2018   HGB 12.4 05/05/2018   HCT 36.9 05/05/2018   MCV 79.4 (L) 05/05/2018   PLT 300 05/05/2018   CMP Latest Ref Rng & Units 05/05/2018  Glucose 70 - 99 mg/dL 85  BUN 6 - 20 mg/dL 14  Creatinine 1.30 - 8.65 mg/dL 7.84  Sodium 696 - 295 mmol/L 137  Potassium 3.5 - 5.1 mmol/L 3.7  Chloride 98 - 111 mmol/L 108  CO2 22 - 32 mmol/L 19(L)  Calcium 8.9 - 10.3 mg/dL 2.8(U)  Total Protein 6.5 - 8.1 g/dL 6.9  Total Bilirubin 0.3 - 1.2 mg/dL 0.4  Alkaline Phos 38 - 126 U/L 150(H)  AST 15 - 41 U/L 19  ALT 0 - 44 U/L 18    Discharge instruction: per After Visit Summary and "Baby and Me Booklet".  After visit meds:  Allergies as of 05/08/2018      Reactions   Penicillins Nausea And Vomiting   Has patient had a PCN reaction causing immediate rash, facial/tongue/throat swelling, SOB or lightheadedness with hypotension: No Has patient had a PCN reaction causing severe rash involving mucus membranes or skin necrosis: No Has patient had a PCN reaction that required hospitalization: No Has patient had a PCN reaction occurring within the last 10 years: No If all of the above answers are "NO", then may proceed with Cephalosporin use.      Medication List    TAKE these medications   calcium carbonate 500 MG chewable tablet Commonly known as:  TUMS - dosed in mg elemental calcium Chew 1 tablet by mouth 5 (five) times daily as needed for indigestion or heartburn.   ibuprofen 600 MG tablet Commonly known as:  ADVIL,MOTRIN Take 1 tablet (600 mg total) by mouth every 6 (six) hours.   Prenatal Vitamins 0.8 MG tablet Take 1 tablet by mouth daily.   senna-docusate 8.6-50 MG tablet Commonly known as:  Senokot-S Take 2 tablets by mouth daily. Start taking on:  05/09/2018   sertraline 25 MG tablet Commonly known as:  ZOLOFT Take 1 tablet (25 mg total) by mouth daily. Increase to 50mg  in 1-2 weeks as tolerated/needed. Start taking on:  05/09/2018       Diet: routine  diet  Activity: Advance as tolerated. Pelvic rest for 6 weeks.   Outpatient follow up:4 weeks Follow up Appt:No future appointments. Follow up Visit:No follow-ups on file.  Postpartum contraception: Depo Provera Given inpatient   Newborn Data: Live born female  Birth Weight: 8 lb 4.1 oz (3745 g) APGAR: 9, 9  Newborn Delivery   Birth date/time:  05/06/2018 01:28:00 Delivery type:  Vaginal, Spontaneous     Baby Feeding: Bottle and Breast Disposition:home with mother   05/08/2018 Allayne Stack, DO   I confirm that I have verified the information documented in the resident's note and that I have also personally reperformed the physical exam and all medical decision making activities.    Luna Kitchens CNM

## 2018-05-11 ENCOUNTER — Telehealth: Payer: Self-pay | Admitting: Family Medicine

## 2018-05-11 ENCOUNTER — Telehealth: Payer: Self-pay | Admitting: *Deleted

## 2018-05-11 DIAGNOSIS — O139 Gestational [pregnancy-induced] hypertension without significant proteinuria, unspecified trimester: Secondary | ICD-10-CM

## 2018-05-11 MED ORDER — ENALAPRIL MALEATE 5 MG PO TABS: 5.0000 mg | ORAL_TABLET | Freq: Every day | ORAL | 0 refills | Status: DC

## 2018-05-11 NOTE — Telephone Encounter (Signed)
Unable to leave message--phone goes directly to voicemail. BP's are rising on Baby[employer]s app--I have sent an email through Baby[employer]s--new rx written

## 2018-05-11 NOTE — Telephone Encounter (Signed)
Pt left message stating that she missed a call from our office. She stated her BP this morning was 149/129. Per chart review, Dr. Shawnie Pons had tried to call pt regarding BP values which were sent via [employer]s. Rx for Vasotec was sent to pt's pharmacy. I returned pt's call and heard message stating that the pt's mailbox is full and cannot accept any messages. Per Dr. Shawnie Pons, pt needs to be notified of Rx sent in and also ask her to sign up for MyChart as a means to communicate with her in a more timely manner.

## 2018-05-12 ENCOUNTER — Telehealth: Payer: Self-pay | Admitting: Family Medicine

## 2018-05-12 ENCOUNTER — Encounter: Payer: Self-pay | Admitting: Family Medicine

## 2018-05-12 NOTE — Telephone Encounter (Signed)
Returned pt's call to inform her of the medication that was called into her pharmacy and to set her up with mychart.  Pt did not pick up and her voicemail box was full.

## 2018-05-12 NOTE — Telephone Encounter (Signed)
Called patient to give her an appointment. No answer, and could not leave a voicemail.

## 2018-05-12 NOTE — Telephone Encounter (Signed)
Attempted to contact unable to LM due VM box being full.  Letter sent.

## 2018-05-26 ENCOUNTER — Other Ambulatory Visit: Payer: Self-pay | Admitting: Family Medicine

## 2018-06-05 ENCOUNTER — Other Ambulatory Visit: Payer: Self-pay | Admitting: Family Medicine

## 2018-06-05 DIAGNOSIS — O139 Gestational [pregnancy-induced] hypertension without significant proteinuria, unspecified trimester: Secondary | ICD-10-CM

## 2018-06-17 ENCOUNTER — Encounter: Payer: Self-pay | Admitting: Nurse Practitioner

## 2018-06-17 ENCOUNTER — Telehealth: Payer: Self-pay | Admitting: Nurse Practitioner

## 2018-06-17 ENCOUNTER — Ambulatory Visit: Payer: Medicaid Other | Admitting: Nurse Practitioner

## 2018-06-17 NOTE — Telephone Encounter (Signed)
Called pt to get her rescheduled for her missed pp visit. No answer and the VM was not setup to leave a message. No show letter mailed.

## 2018-12-07 ENCOUNTER — Other Ambulatory Visit: Payer: Self-pay | Admitting: Family Medicine

## 2018-12-07 DIAGNOSIS — O139 Gestational [pregnancy-induced] hypertension without significant proteinuria, unspecified trimester: Secondary | ICD-10-CM

## 2019-01-08 ENCOUNTER — Other Ambulatory Visit: Payer: Self-pay | Admitting: Family Medicine

## 2019-01-08 DIAGNOSIS — O139 Gestational [pregnancy-induced] hypertension without significant proteinuria, unspecified trimester: Secondary | ICD-10-CM

## 2019-07-17 ENCOUNTER — Encounter (HOSPITAL_COMMUNITY): Payer: Self-pay | Admitting: Emergency Medicine

## 2019-07-17 ENCOUNTER — Other Ambulatory Visit: Payer: Self-pay

## 2019-07-17 ENCOUNTER — Emergency Department (HOSPITAL_COMMUNITY)
Admission: EM | Admit: 2019-07-17 | Discharge: 2019-07-17 | Disposition: A | Discharge status: Home / Self Care | Payer: Medicaid Other | Attending: Emergency Medicine | Admitting: Emergency Medicine

## 2019-07-17 DIAGNOSIS — K0889 Other specified disorders of teeth and supporting structures: Secondary | ICD-10-CM | POA: Insufficient documentation

## 2019-07-17 DIAGNOSIS — Z79899 Other long term (current) drug therapy: Secondary | ICD-10-CM | POA: Insufficient documentation

## 2019-07-17 MED ORDER — CLINDAMYCIN HCL 300 MG PO CAPS: 300.0000 mg | ORAL_CAPSULE | Freq: Three times a day (TID) | ORAL | 0 refills | Status: AC

## 2019-07-17 MED ORDER — HYDROCODONE-ACETAMINOPHEN 5-325 MG PO TABS: 1.0000 | ORAL_TABLET | ORAL | 0 refills | Status: AC | PRN

## 2019-07-17 NOTE — ED Provider Notes (Signed)
[facility]   CSN: [id number] Arrival date & time: 07/17/19  1354    History Chief Complaint  Patient presents with  . Dental Pain    Kayla Creeicole D Griffith is a 28 y.o. female with past medical history who presents for evaluation of dental pain.  Patient states she has had worsening dental pain over the last week however does admit to chronic generalized dental pain.  Patient states she has an appointment at the beginning of January to have all her teeth pulled and have dentures put in.  She called this office and was told she possibly need antibiotics for infection for teeth to be pulled.  Patient states that she had her tooth on her child's head 1 week ago and half of the tooth cracked.  States she has pain worse with hot and cold liquids.  Has been taking Tylenol ibuprofen intermittently for pain.  Denies chance of pregnancy.  Denies fever, chills, nausea, vomiting, drooling, dysphasia, trismus, oral, facial swelling, inability to tolerate p.o. intake, neck pain, neck stiffness.  Denies additional aggravating relieving factors  History obtained from patient and past medical records.  No interpreter used.  HPI     Past Medical History:  Diagnosis Date  . Anxiety   . Depression     Patient Active Problem List   Diagnosis Date Noted  . Gestational hypertension 05/06/2018  . No prenatal care in current pregnancy 05/06/2018  . Normal labor 05/05/2018    Past Surgical History:  Procedure Laterality Date  . NO PAST SURGERIES       OB History    Gravida  2   Para  2   Term  2   Preterm      AB      Living  2     SAB      TAB      Ectopic      Multiple  0   Live Births  2           No family history on file.  Social History   Tobacco Use  . Smoking status: Never Smoker  . Smokeless tobacco: Never Used  Substance Use Topics  . Alcohol use: No  . Drug use: No    Home Medications Prior to Admission  medications   Medication Sig Start Date End Date Taking? Authorizing Provider  calcium carbonate (TUMS - DOSED IN MG ELEMENTAL CALCIUM) 500 MG chewable tablet Chew 1 tablet by mouth 5 (five) times daily as needed for indigestion or heartburn.     [provider]  clindamycin (CLEOCIN) 300 MG capsule Take 1 capsule (300 mg total) by mouth 3 (three) times daily for 5 days. 07/17/19 07/22/19  Fabrizio Filip A, PA-C  enalapril (VASOTEC) 5 MG tablet TAKE 1 TABLET(5 MG) BY MOUTH DAILY 01/08/19   Reva BoresPratt, Tanya S, MD  HYDROcodone-acetaminophen (NORCO/VICODIN) 5-325 MG tablet Take 1 tablet by mouth every 4 (four) hours as needed. 07/17/19   Shloime Keilman A, PA-C  ibuprofen (ADVIL,MOTRIN) 600 MG tablet Take 1 tablet (600 mg total) by mouth every 6 (six) hours. 05/08/18   Allayne StackBeard, Samantha N, DO  Prenatal Multivit-Min-Fe-FA (PRENATAL VITAMINS) 0.8 MG tablet Take 1 tablet by mouth daily. 10/01/17   Horton, Mayer Maskerourtney F, MD  senna-docusate (SENOKOT-S) 8.6-50 MG tablet Take 2 tablets by mouth daily. 05/09/18   Allayne StackBeard, Samantha N, DO  sertraline (ZOLOFT) 25 MG tablet Take 1 tablet (25 mg total) by mouth daily. Increase  to 50mg  in 1-2 weeks as tolerated/needed. 05/09/18   07/09/18, DO    Allergies    Penicillins  Review of Systems   Review of Systems  Constitutional: Negative.   HENT: Positive for dental problem. Negative for congestion, drooling, ear discharge, ear pain, mouth sores, rhinorrhea, sinus pressure, sinus pain, sneezing, sore throat, trouble swallowing and voice change.   Respiratory: Negative.   Cardiovascular: Negative.   Gastrointestinal: Negative.   Genitourinary: Negative.   Musculoskeletal: Negative.   Skin: Negative.   All other systems reviewed and are negative.   Physical Exam Updated Vital Signs BP (!) 166/93 (BP Location: Right Arm)   Pulse (!) 116   Temp 98.1 F (36.7 C) (Oral)   Resp 18   LMP 06/29/2019   SpO2 100%   Physical Exam Vitals and nursing note  reviewed.  Constitutional:      General: She is not in acute distress.    Appearance: She is well-developed. She is not ill-appearing, toxic-appearing or diaphoretic.  HENT:     Head: Normocephalic and atraumatic.     Jaw: There is normal jaw occlusion.     Comments: No facial swelling, submandibular area soft.  No evidence of Ludwig's angina.    Mouth/Throat:     Lips: Pink.     Mouth: Mucous membranes are moist.     Dentition: Abnormal dentition. Does not have dentures. Dental tenderness and dental caries present. No gingival swelling, dental abscesses or gum lesions.     Tongue: No lesions.     Palate: No lesions.     Pharynx: Oropharynx is clear. Uvula midline.     Tonsils: No tonsillar exudate or tonsillar abscesses.     Comments: Overall very poor dentition with multiple missing teeth and dental caries.  Multiple teeth are eroded to the gum line as well as fractured teeth with several teeth with exposed root.  No gingival erythema, no periapical abscess.  Sublingual area soft.  No oral lesions.  Generalized tenderness to all dentition.  No drooling, dysphasia or trismus. Eyes:     Pupils: Pupils are equal, round, and reactive to light.  Neck:     Comments: No neck stiffness or neck rigidity. Cardiovascular:     Rate and Rhythm: Normal rate.  Pulmonary:     Effort: No respiratory distress.  Abdominal:     General: There is no distension.  Musculoskeletal:        General: Normal range of motion.     Cervical back: Normal range of motion.  Skin:    General: Skin is warm and dry.     Capillary Refill: Capillary refill takes less than 2 seconds.     Comments: No edema, erythema or warmth.  No facial fluctuance, induration, cellulitis.  Neurological:     Mental Status: She is alert.    ED Results / Procedures / Treatments   Labs (all labs ordered are listed, but only abnormal results are displayed) Labs Reviewed - No data to display  EKG None  Radiology No results  found.  Procedures Procedures (including critical care time)  Medications Ordered in ED Medications - No data to display  ED Course  I have reviewed the triage vital signs and the nursing notes.  Pertinent labs & imaging results that were available during my care of the patient were reviewed by me and considered in my medical decision making (see chart for details).  28 year old presents for evaluation of dental pain. Afebrile, non  septic, non ill appearing.  No facial swelling, submandibular, sublingual area soft.  No evidence of Ludwig's angina.  No facial fluctuance, induration, cellulitis.  No evidence of PTA or RPA.  No intraoral lesions, swelling.  Overall very poor dentition with multiple missing teeth and dental caries.  Multiple teeth are eroded to the gum line as well as fractured teeth with several teeth with exposed root.  No gingival erythema, no periapical abscess.  Sublingual area soft.  No oral lesions.  Generalized tenderness to all dentition.  No drooling, dysphasia or trismus.  No evidence of gross infection however patient does need extend to dental work performed with dentistry.  We will go short course of pain medicine given multiple expose roots and fractured teeth do not think dental block would benefit patient at this time.  We will also start on antibiotics given she does have follow-up with a dentist.  Discussed closer follow-up with dentistry.  No evidence of deep space infection.  Tolerating p.o. intake.  The patient has been appropriately medically screened and/or stabilized in the ED. I have low suspicion for any other emergent medical condition which would require further screening, evaluation or treatment in the ED or require inpatient management.     MDM Rules/Calculators/A&P                       Final Clinical Impression(s) / ED Diagnoses Final diagnoses:  Pain, dental    Rx / DC Orders ED Discharge Orders         Ordered     HYDROcodone-acetaminophen (NORCO/VICODIN) 5-325 MG tablet  Every 4 hours PRN     07/17/19 1408    clindamycin (CLEOCIN) 300 MG capsule  3 times daily     07/17/19 1409           Jaquanda Wickersham A, PA-C 07/17/19 1414    Fredia Sorrow, MD 07/18/19 1341

## 2019-07-17 NOTE — Discharge Instructions (Signed)
Take the medications as prescribed. You need to follow up with dentistry. I have given you resources to follow up

## 2019-07-17 NOTE — ED Triage Notes (Signed)
C/o dental pain x 1 week.  States she was hit in the tooth accidentally by her child's head 1 week ago and it cracked her tooth.

## 2019-07-24 ENCOUNTER — Other Ambulatory Visit: Payer: Self-pay

## 2019-07-24 ENCOUNTER — Encounter (HOSPITAL_COMMUNITY): Payer: Self-pay

## 2019-07-24 ENCOUNTER — Emergency Department (HOSPITAL_COMMUNITY)
Admission: EM | Admit: 2019-07-24 | Discharge: 2019-07-24 | Disposition: A | Discharge status: Home / Self Care | Payer: Medicaid Other | Attending: Emergency Medicine | Admitting: Emergency Medicine

## 2019-07-24 DIAGNOSIS — Z79899 Other long term (current) drug therapy: Secondary | ICD-10-CM | POA: Diagnosis not present

## 2019-07-24 DIAGNOSIS — K0889 Other specified disorders of teeth and supporting structures: Secondary | ICD-10-CM | POA: Diagnosis present

## 2019-07-24 MED ORDER — NAPROXEN 500 MG PO TABS: 500.0000 mg | ORAL_TABLET | Freq: Two times a day (BID) | ORAL | 0 refills | Status: AC | PRN

## 2019-07-24 MED ORDER — CEPHALEXIN 500 MG PO CAPS: 500.0000 mg | ORAL_CAPSULE | Freq: Two times a day (BID) | ORAL | 0 refills | Status: AC

## 2019-07-24 NOTE — Discharge Instructions (Signed)
Follow-up with dentist as previously discussed. Take Tylenol every 4 hours as needed for pain.  For more severe pain you can take naproxen up to 2 times a day for the next 3 days. Take amoxicillin until you see your dentist or up to 1 week.

## 2019-07-24 NOTE — ED Triage Notes (Signed)
Pt states that she had been having dental pain for over a week, tooth recently fell out and still hurts.

## 2019-07-24 NOTE — ED Provider Notes (Signed)
[facility] EMERGENCY DEPARTMENT Provider Note   CSN: [id number] Arrival date & time: 07/24/19  1849     History Chief Complaint  Patient presents with  . Dental Pain    Kayla Griffith is a 28 y.o. female.  Patient with history of poor dentition, following up with a dentist on Monday presents with worsening pain.  Patient was seen recently given antibiotics and narcotics.  Patient has had worsening pain and finished antibiotics.  Patient is breast-feeding.  No worsening swelling or breathing difficulty.        Past Medical History:  Diagnosis Date  . Anxiety   . Depression     Patient Active Problem List   Diagnosis Date Noted  . Gestational hypertension 05/06/2018  . No prenatal care in current pregnancy 05/06/2018  . Normal labor 05/05/2018    Past Surgical History:  Procedure Laterality Date  . NO PAST SURGERIES       OB History    Gravida  2   Para  2   Term  2   Preterm      AB      Living  2     SAB      TAB      Ectopic      Multiple  0   Live Births  2           No family history on file.  Social History   Tobacco Use  . Smoking status: Never Smoker  . Smokeless tobacco: Never Used  Substance Use Topics  . Alcohol use: No  . Drug use: No    Home Medications Prior to Admission medications   Medication Sig Start Date End Date Taking? Authorizing Provider  calcium carbonate (TUMS - DOSED IN MG ELEMENTAL CALCIUM) 500 MG chewable tablet Chew 1 tablet by mouth 5 (five) times daily as needed for indigestion or heartburn.     [provider]  cephALEXin (KEFLEX) 500 MG capsule Take 1 capsule (500 mg total) by mouth 2 (two) times daily. 07/24/19   Elnora Morrison, MD  enalapril (VASOTEC) 5 MG tablet TAKE 1 TABLET(5 MG) BY MOUTH DAILY 01/08/19   Donnamae Jude, MD  HYDROcodone-acetaminophen (NORCO/VICODIN) 5-325 MG tablet Take 1 tablet by mouth every 4 (four) hours as needed. 07/17/19   Henderly, Britni  A, PA-C  ibuprofen (ADVIL,MOTRIN) 600 MG tablet Take 1 tablet (600 mg total) by mouth every 6 (six) hours. 05/08/18   Patriciaann Clan, DO  naproxen (NAPROSYN) 500 MG tablet Take 1 tablet (500 mg total) by mouth 2 (two) times daily as needed. 07/24/19   Elnora Morrison, MD  Prenatal Multivit-Min-Fe-FA (PRENATAL VITAMINS) 0.8 MG tablet Take 1 tablet by mouth daily. 10/01/17   Horton, Barbette Hair, MD  senna-docusate (SENOKOT-S) 8.6-50 MG tablet Take 2 tablets by mouth daily. 05/09/18   Patriciaann Clan, DO  sertraline (ZOLOFT) 25 MG tablet Take 1 tablet (25 mg total) by mouth daily. Increase to 50mg  in 1-2 weeks as tolerated/needed. 05/09/18   Patriciaann Clan, DO    Allergies    Penicillins  Review of Systems   Review of Systems  Constitutional: Negative for chills and fever.  HENT: Negative for congestion.   Respiratory: Negative for shortness of breath.   Cardiovascular: Negative for chest pain.  Gastrointestinal: Negative for abdominal pain and vomiting.  Musculoskeletal: Negative for back pain, neck pain and neck stiffness.  Skin: Negative for rash.  Neurological: Negative for light-headedness and headaches.  Physical Exam Updated Vital Signs BP (!) 162/104   Pulse 87   Temp 98.4 F (36.9 C) (Oral)   Resp 18   LMP 06/29/2019   SpO2 99%   Physical Exam Vitals and nursing note reviewed.  Constitutional:      Appearance: She is well-developed.  HENT:     Head: Normocephalic.     Comments: Patient has significant poor dentition especially molars bilateral upper and lower.  Multiple missing teeth.  Patient has tenderness gingiva left upper left lower and right above incisor.  No abscess palpated, no trismus. Eyes:     General:        Right eye: No discharge.        Left eye: No discharge.  Neck:     Trachea: No tracheal deviation.  Cardiovascular:     Rate and Rhythm: Normal rate.  Pulmonary:     Effort: Pulmonary effort is normal.  Musculoskeletal:     Cervical  back: Normal range of motion and neck supple. No rigidity.  Lymphadenopathy:     Cervical: No cervical adenopathy.  Skin:    General: Skin is warm.     Findings: No rash.  Neurological:     Mental Status: She is alert and oriented to person, place, and time.     ED Results / Procedures / Treatments   Labs (all labs ordered are listed, but only abnormal results are displayed) Labs Reviewed - No data to display  EKG None  Radiology No results found.  Procedures Procedures (including critical care time)  Medications Ordered in ED Medications - No data to display  ED Course  I have reviewed the triage vital signs and the nursing notes.  Pertinent labs & imaging results that were available during my care of the patient were reviewed by me and considered in my medical decision making (see chart for details).    MDM Rules/Calculators/A&P                      Patient presents with dental pain, gingivitis, poor dentition concern for mild infection.  Patient is breast-feeding and reviewed medications Keflex is very low risk and naproxen can be used for a few days for severe breakthrough pain.  Discussed using Tylenol primarily. Final Clinical Impression(s) / ED Diagnoses Final diagnoses:  Pain, dental    Rx / DC Orders ED Discharge Orders         Ordered    cephALEXin (KEFLEX) 500 MG capsule  2 times daily     07/24/19 2022    naproxen (NAPROSYN) 500 MG tablet  2 times daily PRN     07/24/19 2023           Blane Ohara, MD 07/24/19 2026
